# Patient Record
Sex: Male | Born: 1935 | Race: White | Hispanic: No | Marital: Married | Smoking: Never smoker
Health system: Southern US, Community
[De-identification: ages and names within clinical notes are randomized; demographics above are authoritative.]

## PROBLEM LIST (undated history)

## (undated) DIAGNOSIS — I10 Essential (primary) hypertension: Secondary | ICD-10-CM

## (undated) DIAGNOSIS — R0609 Other forms of dyspnea: Secondary | ICD-10-CM

## (undated) DIAGNOSIS — R0600 Dyspnea, unspecified: Secondary | ICD-10-CM

## (undated) DIAGNOSIS — E538 Deficiency of other specified B group vitamins: Secondary | ICD-10-CM

## (undated) DIAGNOSIS — I119 Hypertensive heart disease without heart failure: Secondary | ICD-10-CM

## (undated) DIAGNOSIS — I639 Cerebral infarction, unspecified: Secondary | ICD-10-CM

## (undated) DIAGNOSIS — R0602 Shortness of breath: Secondary | ICD-10-CM

## (undated) DIAGNOSIS — M869 Osteomyelitis, unspecified: Secondary | ICD-10-CM

## (undated) DIAGNOSIS — E7800 Pure hypercholesterolemia, unspecified: Secondary | ICD-10-CM

## (undated) DIAGNOSIS — I2510 Atherosclerotic heart disease of native coronary artery without angina pectoris: Secondary | ICD-10-CM

## (undated) HISTORY — DX: Pure hypercholesterolemia, unspecified: E78.00

## (undated) HISTORY — DX: Deficiency of other specified B group vitamins: E53.8

## (undated) HISTORY — DX: Shortness of breath: R06.02

## (undated) HISTORY — DX: Osteomyelitis, unspecified: M86.9

## (undated) HISTORY — DX: Other forms of dyspnea: R06.09

## (undated) HISTORY — DX: Dyspnea, unspecified: R06.00

## (undated) HISTORY — DX: Essential (primary) hypertension: I10

## (undated) HISTORY — PX: APPENDECTOMY: SHX54

## (undated) HISTORY — DX: Cerebral infarction, unspecified: I63.9

## (undated) HISTORY — PX: LEG AMPUTATION BELOW KNEE: SHX694

## (undated) HISTORY — DX: Atherosclerotic heart disease of native coronary artery without angina pectoris: I25.10

## (undated) HISTORY — DX: Hypertensive heart disease without heart failure: I11.9

---

## 1998-06-01 ENCOUNTER — Other Ambulatory Visit: Admission: RE | Admit: 1998-06-01 | Discharge: 1998-06-01 | Payer: Self-pay | Admitting: Cardiology

## 2012-03-05 ENCOUNTER — Encounter: Payer: Self-pay | Admitting: *Deleted

## 2012-03-05 DIAGNOSIS — I10 Essential (primary) hypertension: Secondary | ICD-10-CM

## 2012-10-27 ENCOUNTER — Institutional Professional Consult (permissible substitution): Payer: Self-pay | Admitting: Cardiology

## 2012-11-11 ENCOUNTER — Institutional Professional Consult (permissible substitution): Payer: Self-pay | Admitting: Cardiology

## 2012-11-18 ENCOUNTER — Ambulatory Visit
Admission: RE | Admit: 2012-11-18 | Discharge: 2012-11-18 | Disposition: A | Discharge status: Home / Self Care | Payer: Medicare Other | Source: Ambulatory Visit | Attending: Cardiovascular Disease | Admitting: Cardiovascular Disease

## 2012-11-18 ENCOUNTER — Encounter: Payer: Self-pay | Admitting: Cardiology

## 2012-11-18 ENCOUNTER — Ambulatory Visit (INDEPENDENT_AMBULATORY_CARE_PROVIDER_SITE_OTHER): Payer: Self-pay | Admitting: Cardiology

## 2012-11-18 VITALS — BP 122/78 | HR 68 | Ht 71.0 in | Wt 169.6 lb

## 2012-11-18 DIAGNOSIS — E538 Deficiency of other specified B group vitamins: Secondary | ICD-10-CM

## 2012-11-18 DIAGNOSIS — I10 Essential (primary) hypertension: Secondary | ICD-10-CM

## 2012-11-18 DIAGNOSIS — E7800 Pure hypercholesterolemia, unspecified: Secondary | ICD-10-CM

## 2012-11-18 DIAGNOSIS — R011 Cardiac murmur, unspecified: Secondary | ICD-10-CM

## 2012-11-18 DIAGNOSIS — I119 Hypertensive heart disease without heart failure: Secondary | ICD-10-CM

## 2012-11-18 NOTE — Patient Instructions (Addendum)
Your physician has requested that you have an echocardiogram. Echocardiography is a painless test that uses sound waves to create images of your heart. It provides your doctor with information about the size and shape of your heart and how well your heart's chambers and valves are working. This procedure takes approximately one hour. There are no restrictions for this procedure.   Will have you go for chest xray at the [facility])   Your physician recommends that you continue on your current medications as directed. Please refer to the Current Medication list given to you today.  Your physician wants you to follow-up in: 6 months with fasting labs (lp/bmet/hfp/cbc) You will receive a reminder letter in the mail two months in advance. If you don't receive a letter, please call our office to schedule the follow-up appointment.

## 2012-11-18 NOTE — Assessment & Plan Note (Signed)
The patient has a soft systolic ejection murmur at the base.  Will check an echocardiogram

## 2012-11-18 NOTE — Addendum Note (Signed)
Addended by: Regis Bill B on: 11/18/2012 03:12 PM   Modules accepted: Orders

## 2012-11-18 NOTE — Assessment & Plan Note (Signed)
The patient has a history of hypercholesterolemia and is on Lipitor.  He has not been having any myalgias from Lipitor.

## 2012-11-18 NOTE — Assessment & Plan Note (Signed)
The patient denies any history of exertional chest pain.  He has not had any symptoms of congestive heart failure.

## 2012-11-18 NOTE — Addendum Note (Signed)
Addended by: Regis Bill B on: 11/18/2012 03:20 PM   Modules accepted: Orders

## 2012-11-18 NOTE — Progress Notes (Signed)
Marshell Garfinkel Date of Birth:  1924-07-03 [facility] [address] [place], [place]  [address] [phone]         Fax   [phone]  History of Present Illness: This pleasant 78 year old gentleman is seen after a long absence.  He has a long history of essential hypertension and a history of palpitations.  Since we have last seen him he has been diagnosed with vitamin B12 deficiency.  He has had a previous right BK amputation for osteomyelitis of the leg.  He has a history of having had a stroke about 7 years ago.  He does not have any history of ischemic heart disease.  He does not have any history of prior myocardial infarction.  He has not previously been told of a heart murmur.  He saw his primary care physician Dr. Oneal Grout in [facility] in September and had lab work.  Patient states that his lab work was satisfactory at that time.  Current Outpatient Prescriptions  Medication Sig Dispense Refill  . amLODipine (NORVASC) 10 MG tablet Take 10 mg by mouth daily.      Marland Kitchen aspirin 81 MG tablet Take 81 mg by mouth 2 (two) times daily.      . Cyanocobalamin (VITAMIN B-12 IJ) Inject as directed every 30 (thirty) days.      Marland Kitchen lisinopril (PRINIVIL,ZESTRIL) 40 MG tablet Take 40 mg by mouth daily.      Marland Kitchen lovastatin (MEVACOR) 40 MG tablet Take 40 mg by mouth at bedtime.      . multivitamin (THERAGRAN) per tablet Take 1 tablet by mouth daily.      . propranolol (INDERAL) 20 MG tablet Take 20 mg by mouth 2 (two) times daily.      . hydrochlorothiazide (MICROZIDE) 12.5 MG capsule Take 12.5 mg by mouth daily.        Allergies  Allergen Reactions  . Codeine     Patient Active Problem List  Diagnosis  . Hypertension  . Pure hypercholesterolemia  . Heart murmur  . B12 deficiency    History  Smoking status  . Never Smoker   Smokeless tobacco  . Never Used    History  Alcohol Use No    Family History  Problem Relation Age of Onset  . Pneumonia Father 66   . Hypertension Mother   . Heart disease Mother   . Stroke Sister   . Stroke Sister   . Stroke Brother     Review of Systems: Constitutional: no fever chills diaphoresis or fatigue or change in weight.  Head and neck: no hearing loss, no epistaxis, no photophobia or visual disturbance. Respiratory: No cough, shortness of breath or wheezing. Cardiovascular: No chest pain peripheral edema, palpitations. Gastrointestinal: No abdominal distention, no abdominal pain, no change in bowel habits hematochezia or melena. Genitourinary: No dysuria, no frequency, no urgency, no nocturia.  The patient does have an enlarged prostate and a history of hesitancy. Musculoskeletal:No arthralgias, no back pain, no gait disturbance or myalgias. Neurological: No dizziness, no headaches, no numbness, no seizures, no syncope, no weakness, no tremors. Hematologic: No lymphadenopathy, no easy bruising. Psychiatric: No confusion, no hallucinations, no sleep disturbance.    Physical Exam: Filed Vitals:   11/18/12 1333  BP: 122/78  Pulse: 68   the general appearance reveals an alert elderly gentleman in no distress.The head and neck exam reveals pupils equal and reactive.  Extraocular movements are full.  There is no scleral icterus.  The mouth  and pharynx are normal.  The neck is supple.  The carotids reveal no bruits.  The jugular venous pressure is normal.  The  thyroid is not enlarged.  There is no lymphadenopathy.  The chest is clear to percussion and auscultation.  There are no rales or rhonchi.  Expansion of the chest is symmetrical.  The precordium is quiet.  The first heart sound is normal.  The second heart sound is physiologically split.  There is no  gallop rub or click.  There is a grade 2/6 systolic ejection murmur at the base. There is no abnormal lift or heave.  The abdomen is soft and nontender.  The bowel sounds are normal.  The liver and spleen are not enlarged.  There are no abdominal masses.   There are no abdominal bruits.  Extremities reveal good pedal pulses in the left foot.  There is a right BK amputation with prosthesis.  There is no phlebitis or edema.  There is no cyanosis or clubbing.  Strength is normal and symmetrical in all extremities.  There is no lateralizing weakness.  There are no sensory deficits.  The skin is warm and dry.  There is no rash.  EKG shows normal sinus rhythm and questionable old anteroseptal myocardial infarction   Assessment / Plan:  Disposition: We will get a chest x-ray today.  We will arrange for a two-dimensional echocardiogram.  He will continue same medication he will return in 6 months for a followup office visit lipid panel hepatic function panel basal metabolic panel and CBC.

## 2012-11-19 ENCOUNTER — Telehealth: Payer: Self-pay | Admitting: *Deleted

## 2012-11-19 NOTE — Telephone Encounter (Signed)
Advised wife of xray results.  She did state patient wanted to wait until after Christmas to have Echo.  Patient has cancelled for now and wife will call back to reschedule. Will forward to  Dr. Patty Sermons to make him aware

## 2012-11-19 NOTE — Telephone Encounter (Signed)
Okay to wait until after Christmas for the echo.

## 2012-11-19 NOTE — Telephone Encounter (Signed)
Message copied by Burnell Blanks on Wed Nov 19, 2012  9:38 AM ------      Message from: Cassell Clement      Created: Tue Nov 18, 2012  8:30 PM       Chest xray is okay.  Heart size normal. No CHF seen.

## 2012-11-26 ENCOUNTER — Ambulatory Visit (HOSPITAL_BASED_OUTPATIENT_CLINIC_OR_DEPARTMENT_OTHER): Payer: Medicare Other

## 2013-07-24 ENCOUNTER — Telehealth: Payer: Self-pay | Admitting: Cardiology

## 2013-07-24 NOTE — Telephone Encounter (Signed)
New Prob  Pt has to have a medical report completed for his drivers License/ Needs an appt as soon as possible

## 2013-07-24 NOTE — Telephone Encounter (Signed)
**Note De-Identified Ercel Pepitone Obfuscation** I left message on pts home phone for pt to call office as the cell number he left is not in service at this time. Per Dr. Patty Sermons the pt will need to refer to his PCP as pt has only been seen by Dr Patty Sermons once since 1999 and did not f/u as directed at last OV.

## 2013-12-22 IMAGING — CR DG CHEST 2V
2 series · 2 of 2 positions shown · non-contrast
Comparison: None.

CLINICAL DATA: History of heart murmur

CHEST - 2 VIEW

[view not recorded (1 of 2)]
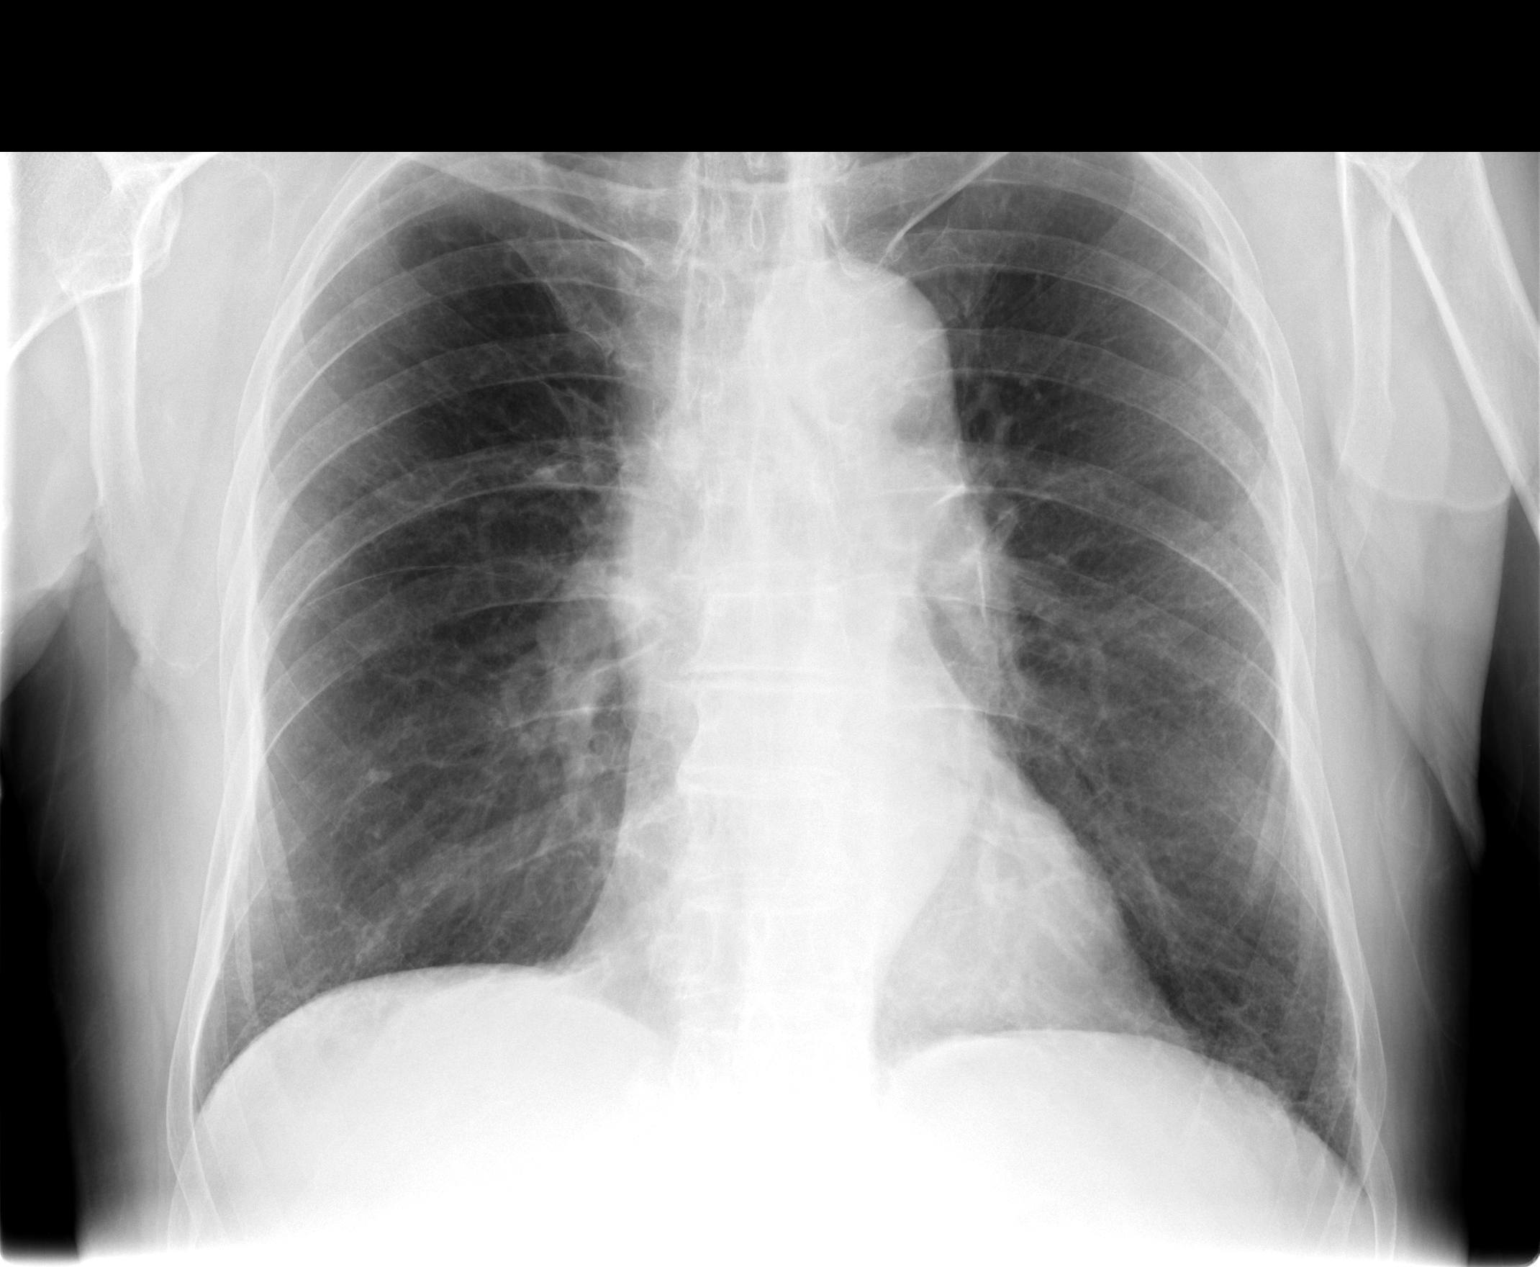

[view not recorded (2 of 2)]
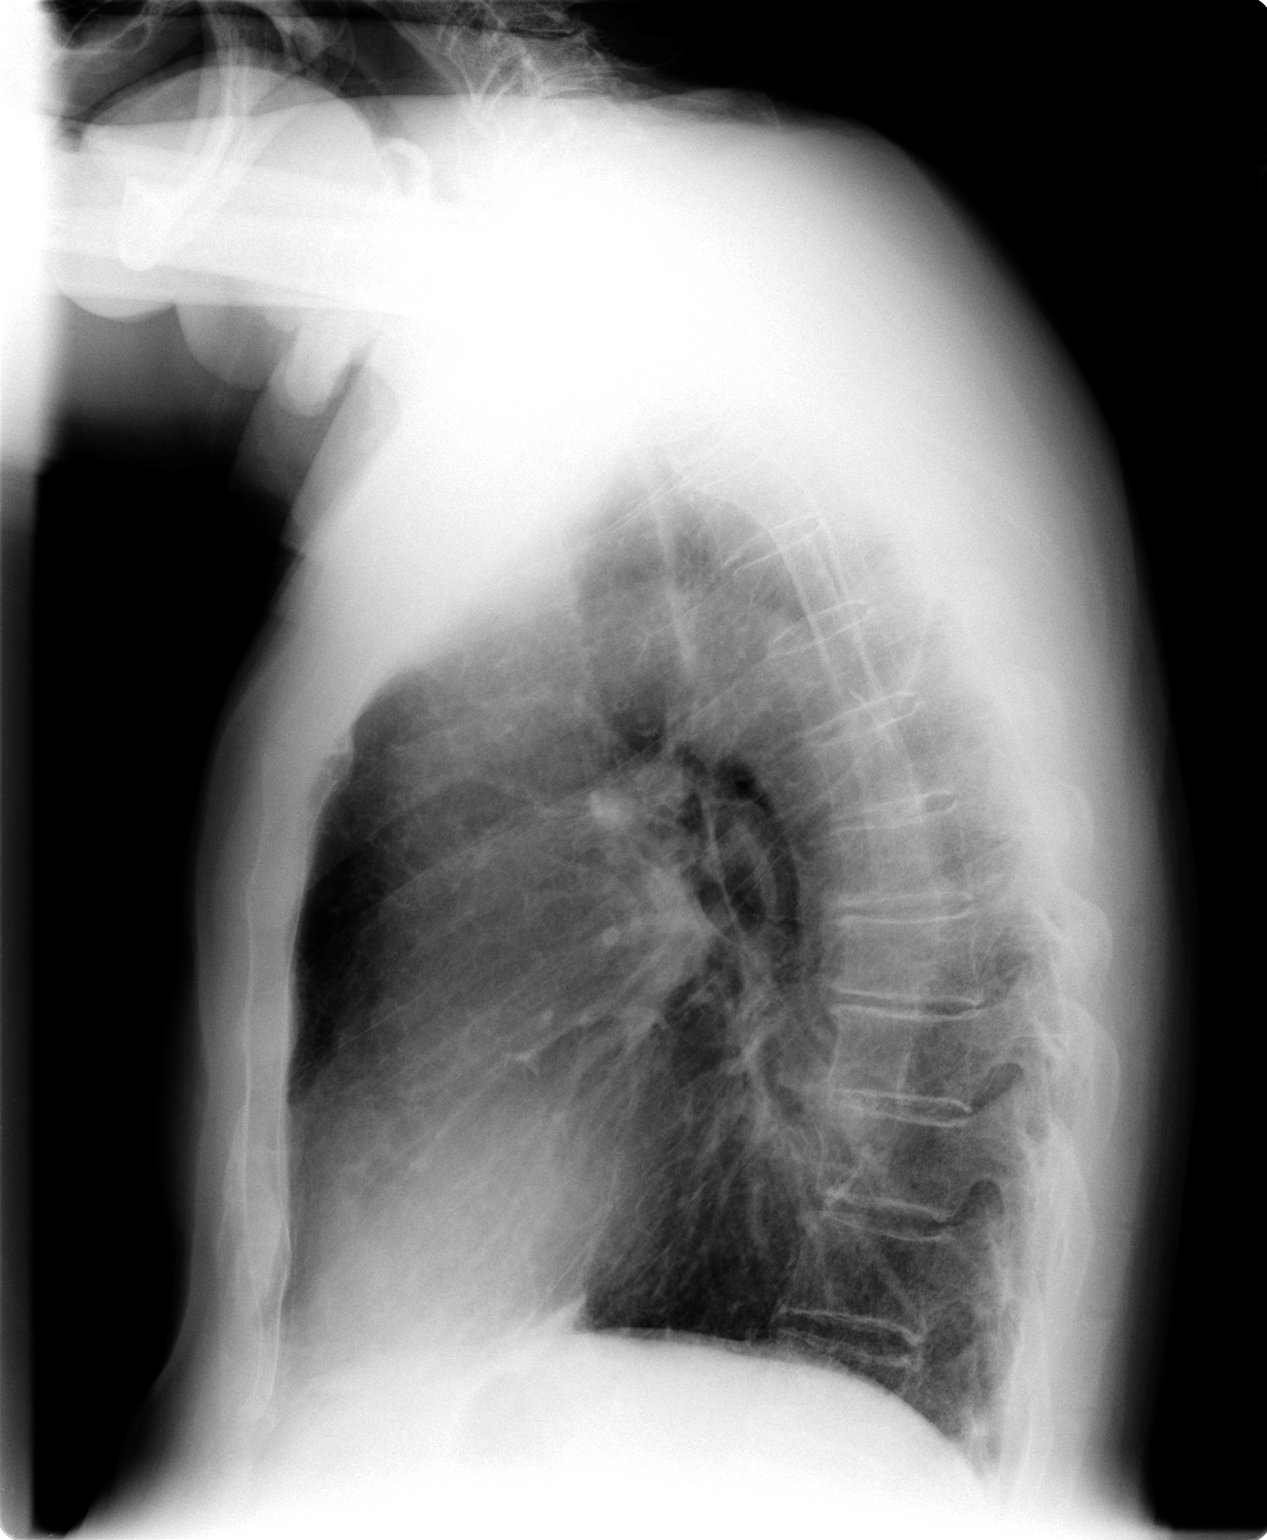

[2 of 2 positions shown; findings below may reference images not displayed]

FINDINGS: The lungs are clear but slightly hyperaerated.
Mediastinal contours appear normal.  The heart is within normal
limits in size.  No bony abnormality is seen.
IMPRESSION: No active lung disease.  Slight hyperaeration

## 2015-10-26 ENCOUNTER — Telehealth: Payer: Self-pay | Admitting: Cardiology

## 2015-10-26 NOTE — Telephone Encounter (Signed)
[facility] placed in chart prep bin for 12/8 appointment with Dr.Brackbill

## 2015-11-24 ENCOUNTER — Ambulatory Visit: Payer: Self-pay | Admitting: Cardiology

## 2016-01-03 ENCOUNTER — Encounter: Payer: Self-pay | Admitting: Cardiology

## 2016-01-13 ENCOUNTER — Encounter: Payer: Self-pay | Admitting: Cardiology

## 2016-01-13 ENCOUNTER — Ambulatory Visit (INDEPENDENT_AMBULATORY_CARE_PROVIDER_SITE_OTHER): Payer: Medicare HMO | Admitting: Cardiology

## 2016-01-13 VITALS — BP 126/70 | HR 62 | Ht 72.0 in | Wt 153.1 lb

## 2016-01-13 DIAGNOSIS — R0609 Other forms of dyspnea: Secondary | ICD-10-CM

## 2016-01-13 DIAGNOSIS — R011 Cardiac murmur, unspecified: Secondary | ICD-10-CM

## 2016-01-13 NOTE — Progress Notes (Signed)
Cardiology Office Note   Date:  01/13/2016   ID:  Terrence Bates, DOB 19-Apr-1924, MRN [id number]  PCP:  Nehemiah Massed., MD  Cardiologist: Cassell Clement MD  Chief Complaint  Patient presents with  . Chest Pain    C/o shortness of breath. Denies le edema or claudication      History of Present Illness: Terrence Bates is a 82 y.o. male who presents for follow-up of dyspnea  This pleasant 82 year old gentleman is seen after a three-year absence.When we saw him 3 years ago we noticed a systolic heart murmur.  We requested that he return for an echocardiogram.  However the chart reveals no echo having been done.  He has a long history of essential hypertension and a history of palpitations. Since we have last seen him he has been diagnosed with vitamin B12 deficiency. He has had a previous right BK amputation for osteomyelitis of the leg. He has a history of having had a stroke about 10 years ago. He does not have any history of ischemic heart disease. He does not have any history of prior myocardial infarction. He states that his dyspnea has been gradual in onset and is related to exertion.  He has not been experiencing any exertional chest pain.  He has had some musculoskeletal type lateral chest wall discomfort.  His primary care provider gave him a trial of an albuterol inhaler which she says has not helped his dyspnea.  He has not been aware of any wheezing or asthma.  Past Medical History  Diagnosis Date  . Osteomyelitis of lower leg (HCC)     post lower right leg amputation and prosthesis   . Coronary artery disease   . Stroke [facility])     right brain stroke with left-sided weakness which has essentially resolved  . Hypertension   . Hypercholesterolemia   . Hypertensive cardiovascular disease   . Vitamin B 12 deficiency   . Shortness of breath   . DOE (dyspnea on exertion)     Past Surgical History  Procedure Laterality Date  . Leg amputation below knee        right lower leg due to osteomyelitis -- with prosthesis  . Appendectomy       Current Outpatient Prescriptions  Medication Sig Dispense Refill  . albuterol (PROVENTIL HFA;VENTOLIN HFA) 108 (90 Base) MCG/ACT inhaler Inhale 2 puffs into the lungs every 6 (six) hours as needed. wheezing    . amLODipine (NORVASC) 10 MG tablet Take 10 mg by mouth daily.    Marland Kitchen aspirin 81 MG tablet Take 81 mg by mouth 2 (two) times daily.    Marland Kitchen atorvastatin (LIPITOR) 20 MG tablet Take 20 mg by mouth daily.    . Cyanocobalamin (VITAMIN B-12 IJ) Inject as directed every 30 (thirty) days.    . metoprolol tartrate (LOPRESSOR) 25 MG tablet Take 25 mg by mouth 2 (two) times daily.    . multivitamin (THERAGRAN) per tablet Take 1 tablet by mouth daily.    . nitroGLYCERIN (NITROSTAT) 0.4 MG SL tablet Place 0.4 mg under the tongue every 5 (five) minutes as needed for chest pain.    Marland Kitchen omeprazole (PRILOSEC) 20 MG capsule Take 20 mg by mouth daily.     No current facility-administered medications for this visit.    Allergies:   Codeine    Social History:  The patient  reports that he has never smoked. He has never used smokeless tobacco. He reports that he does  not drink alcohol or use illicit drugs.   Family History:  [name] includes Heart disease in his mother; Hypertension in his mother; Pneumonia (age of onset: 49) in his father; Stroke in his brother, sister, and sister.    ROS:  Please see the history of present illness.   Otherwise, review of systems are positive for none.   All other systems are reviewed and negative.    PHYSICAL EXAM: VS:  BP 126/70 mmHg  Pulse 62  Ht 6' (1.829 m)  Wt 153 lb 1.9 oz (69.455 kg)  BMI 20.76 kg/m2 , BMI Body mass index is 20.76 kg/(m^2). GEN: Well nourished, well developed, in no acute distress HEENT: normal Neck: no JVD, carotid bruits, or masses Cardiac: RRR; there is a grade 2/6 systolic ejection murmur at the base and apex.  No diastolic murmur.   No gallop, rubs, or gallops,no edema  Respiratory:  clear to auscultation bilaterally, normal work of breathing GI: soft, nontender, nondistended, + BS MS: no deformity or atrophy.  His right lower leg as a prosthesis Skin: warm and dry, no rash Neuro:  Strength and sensation are intact Psych: euthymic mood, full affect   EKG:  EKG is ordered today. The ekg ordered today demonstrates normal sinus rhythm.  Nonspecific ST-T wave changes.  Since prior tracing of 11/18/12, slight improvement in T-wave abnormalities.   Recent Labs: No results found for requested labs within last 365 days.    Lipid Panel No results found for: CHOL, TRIG, HDL, CHOLHDL, VLDL, LDLCALC, LDLDIRECT    Wt Readings from Last 3 Encounters:  01/13/16 153 lb 1.9 oz (69.455 kg)  11/18/12 169 lb 9.6 oz (76.93 kg)       ASSESSMENT AND PLAN:  1.  Exertional dyspnea probably secondary to COPD.  Chest x-ray shows COPD.  He is a former smoker. 2.  Prior amputation of right lower leg for osteomyelitis.  He has a functioning prosthesis. 3.  Systolic heart murmur   Current medicines are reviewed at length with the patient today.  The patient does not have concerns regarding medicines.  The following changes have been made:  no change  Labs/ tests ordered today include:   Orders Placed This Encounter  Procedures  . EKG 12-Lead  . Echocardiogram     Disposition:   We will have him return soon for his two-dimensional echocardiogram.  We had recommended that 3 years ago but he never had it done.  If the echo is unremarkable, no further cardiology testing is warranted at this time.  He will return when necessary.  Karie Schwalbe MD 01/13/2016 5:14 PM    [facility] [address] [place], [place], [place]  [address] Phone: [phone]; Fax: [phone]

## 2016-01-13 NOTE — Patient Instructions (Addendum)
Medication Instructions:  Your physician recommends that you continue on your current medications as directed. Please refer to the Current Medication list given to you today.  If you need a refill on your cardiac medications before your next appointment, please call your pharmacy.  Labwork:  NONE ORDER TODAY    Testing/Procedures:  Your physician has requested that you have an echocardiogram. Echocardiography is a painless test that uses sound waves to create images of your heart. It provides your doctor with information about the size and shape of your heart and how well your heart's chambers and valves are working. This procedure takes approximately one hour. There are no restrictions for this procedure.     Follow-Up:  AS NEEDED FOR  ANY CARDIAC RELATED SYMPTOMS    Any Other Special Instructions Will Be Listed Below (If Applicable).

## 2016-01-26 ENCOUNTER — Ambulatory Visit (HOSPITAL_COMMUNITY): Payer: Medicare HMO | Attending: Cardiology

## 2016-01-26 ENCOUNTER — Other Ambulatory Visit: Payer: Self-pay

## 2016-01-26 DIAGNOSIS — E785 Hyperlipidemia, unspecified: Secondary | ICD-10-CM | POA: Diagnosis not present

## 2016-01-26 DIAGNOSIS — I351 Nonrheumatic aortic (valve) insufficiency: Secondary | ICD-10-CM | POA: Diagnosis not present

## 2016-01-26 DIAGNOSIS — I340 Nonrheumatic mitral (valve) insufficiency: Secondary | ICD-10-CM | POA: Diagnosis not present

## 2016-01-26 DIAGNOSIS — R011 Cardiac murmur, unspecified: Secondary | ICD-10-CM | POA: Diagnosis not present

## 2016-01-26 DIAGNOSIS — R0609 Other forms of dyspnea: Secondary | ICD-10-CM

## 2016-01-26 DIAGNOSIS — I10 Essential (primary) hypertension: Secondary | ICD-10-CM | POA: Insufficient documentation

## 2016-01-26 DIAGNOSIS — Z8249 Family history of ischemic heart disease and other diseases of the circulatory system: Secondary | ICD-10-CM | POA: Diagnosis not present

## 2016-01-26 DIAGNOSIS — I517 Cardiomegaly: Secondary | ICD-10-CM | POA: Insufficient documentation

## 2016-01-26 DIAGNOSIS — I071 Rheumatic tricuspid insufficiency: Secondary | ICD-10-CM | POA: Diagnosis not present

## 2021-04-16 DEATH — deceased
